# Patient Record
Sex: Female | Born: 1985 | Race: White | Hispanic: No | Marital: Married | Smoking: Never smoker
Health system: Southern US, Community
[De-identification: ages and names within clinical notes are randomized; demographics above are authoritative.]

---

## 2013-10-05 ENCOUNTER — Other Ambulatory Visit (HOSPITAL_COMMUNITY): Payer: Self-pay | Admitting: Obstetrics and Gynecology

## 2013-10-05 DIAGNOSIS — N979 Female infertility, unspecified: Secondary | ICD-10-CM

## 2013-10-11 ENCOUNTER — Ambulatory Visit (HOSPITAL_COMMUNITY): Payer: BC Managed Care – PPO

## 2013-10-11 ENCOUNTER — Ambulatory Visit (HOSPITAL_COMMUNITY)
Admission: RE | Admit: 2013-10-11 | Discharge: 2013-10-11 | Disposition: A | Discharge status: Home / Self Care | Payer: BC Managed Care – PPO | Source: Ambulatory Visit | Attending: Obstetrics and Gynecology | Admitting: Obstetrics and Gynecology

## 2013-10-11 DIAGNOSIS — N979 Female infertility, unspecified: Secondary | ICD-10-CM | POA: Insufficient documentation

## 2013-10-11 MED ORDER — IOHEXOL 300 MG/ML  SOLN
20.0000 mL | Freq: Once | INTRAMUSCULAR | Status: AC | PRN
  Administered 2013-10-11: 6 mL

## 2014-07-11 IMAGING — RF DG HYSTEROGRAM
5 series · 5 of 5 positions shown · IV contrast (omnipaque)
Comparison: None.

FLUOROSCOPY TIME:  48 seconds

CLINICAL DATA: Infertility

EXAM:
HYSTEROSALPINGOGRAM
TECHNIQUE: Following cleansing of the cervix and vagina with Betadine solution,
a hysterosalpingogram was performed using a 5-French
hysterosalpingogram catheter and Omnipaque 300 contrast. The patient
tolerated the examination without difficulty.

[Series 1: run · 1 of 1 slices shown (1 of 5)]
[im 1/1]
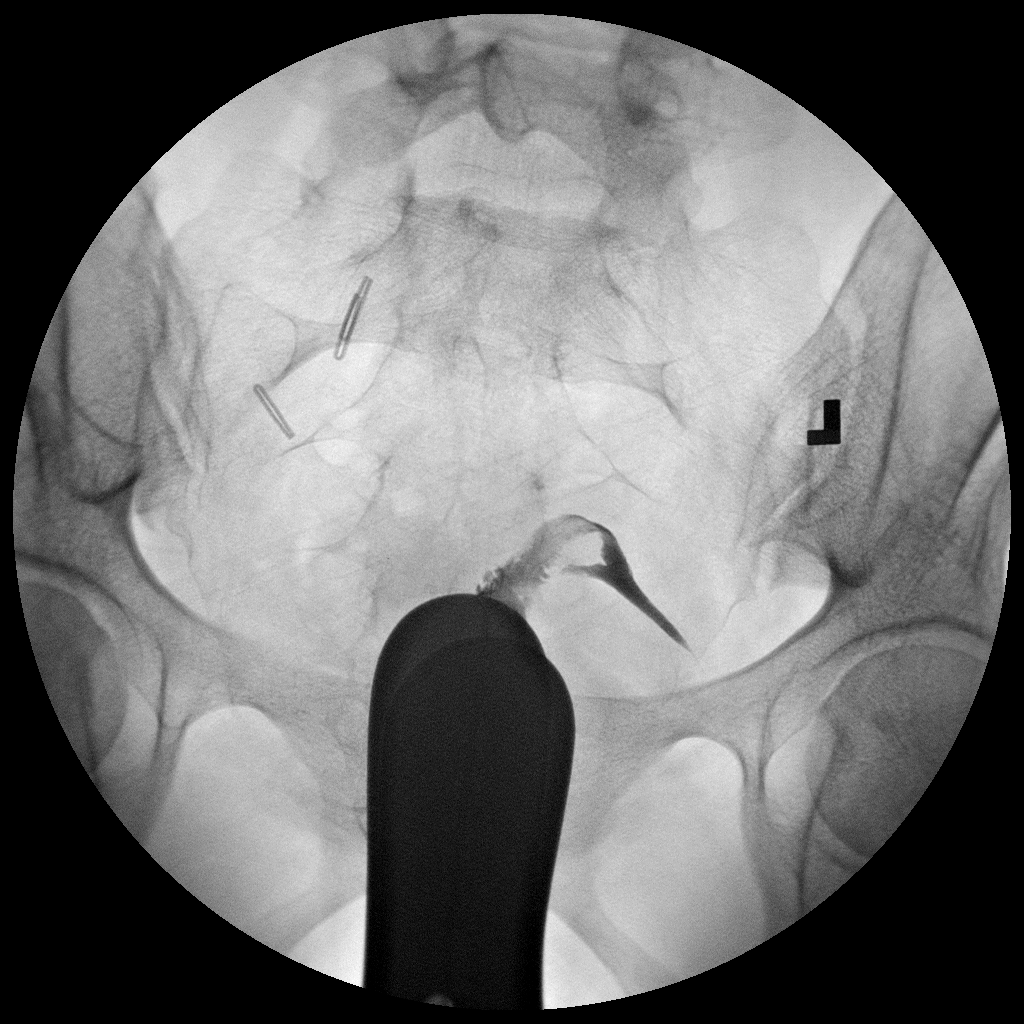

[Series 2: run · 1 of 1 slices shown (2 of 5)]
[im 1/1]
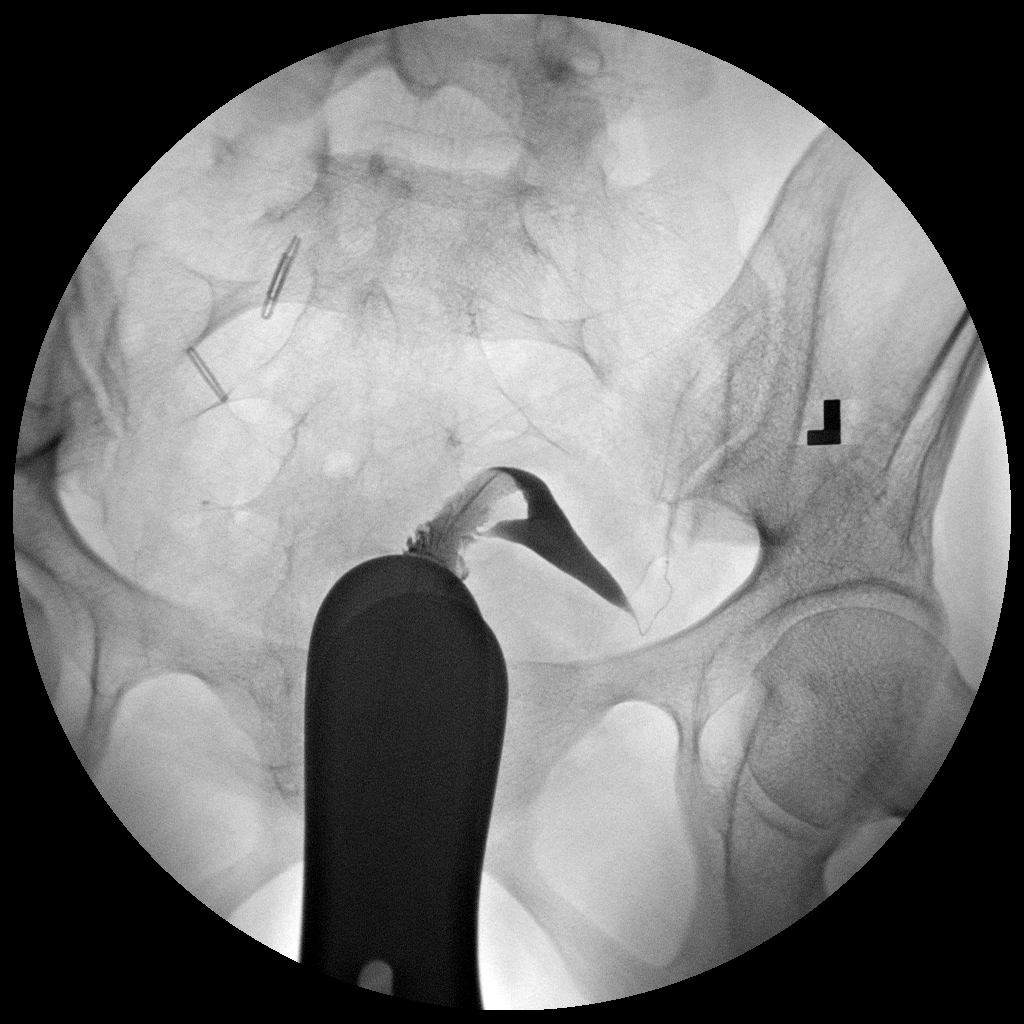

[Series 3: run · 1 of 1 slices shown (3 of 5)]
[im 1/1]
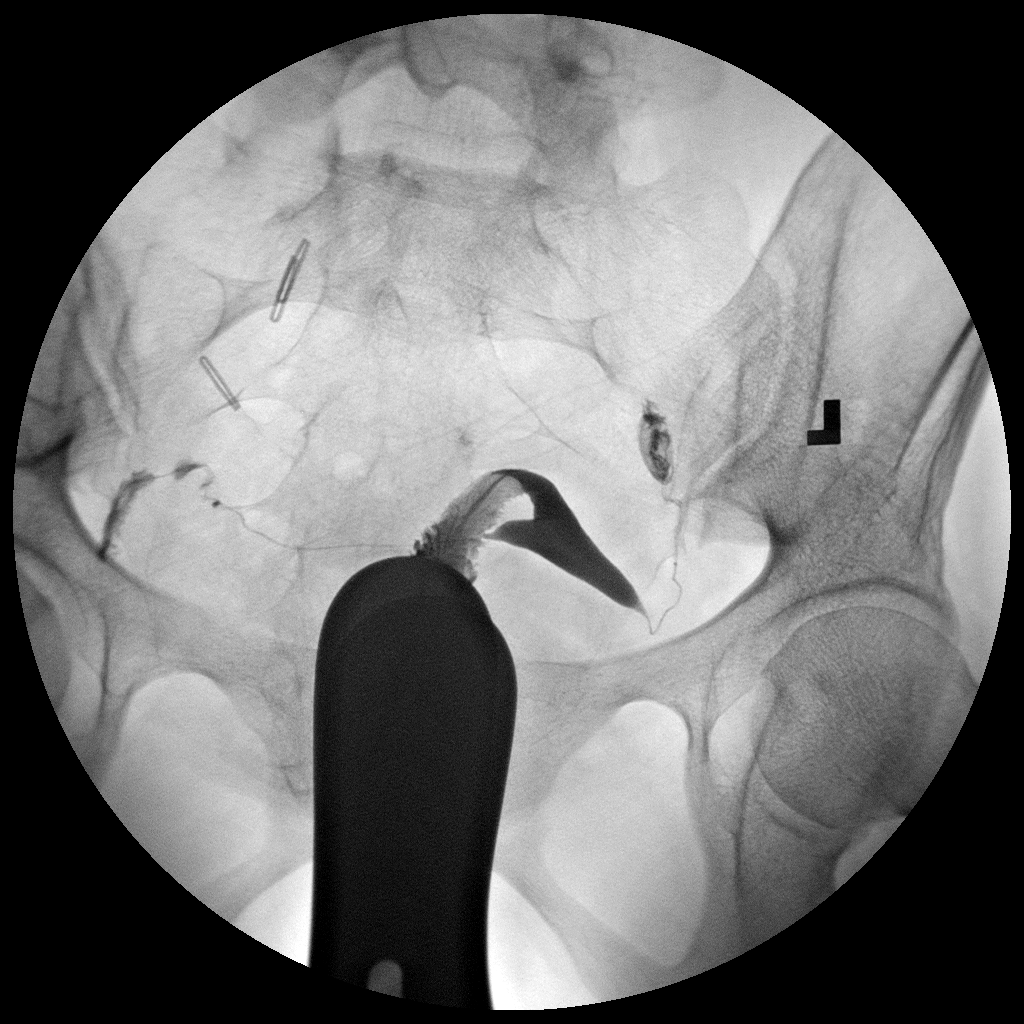

[Series 4: run · 1 of 1 slices shown (4 of 5)]
[im 1/1]
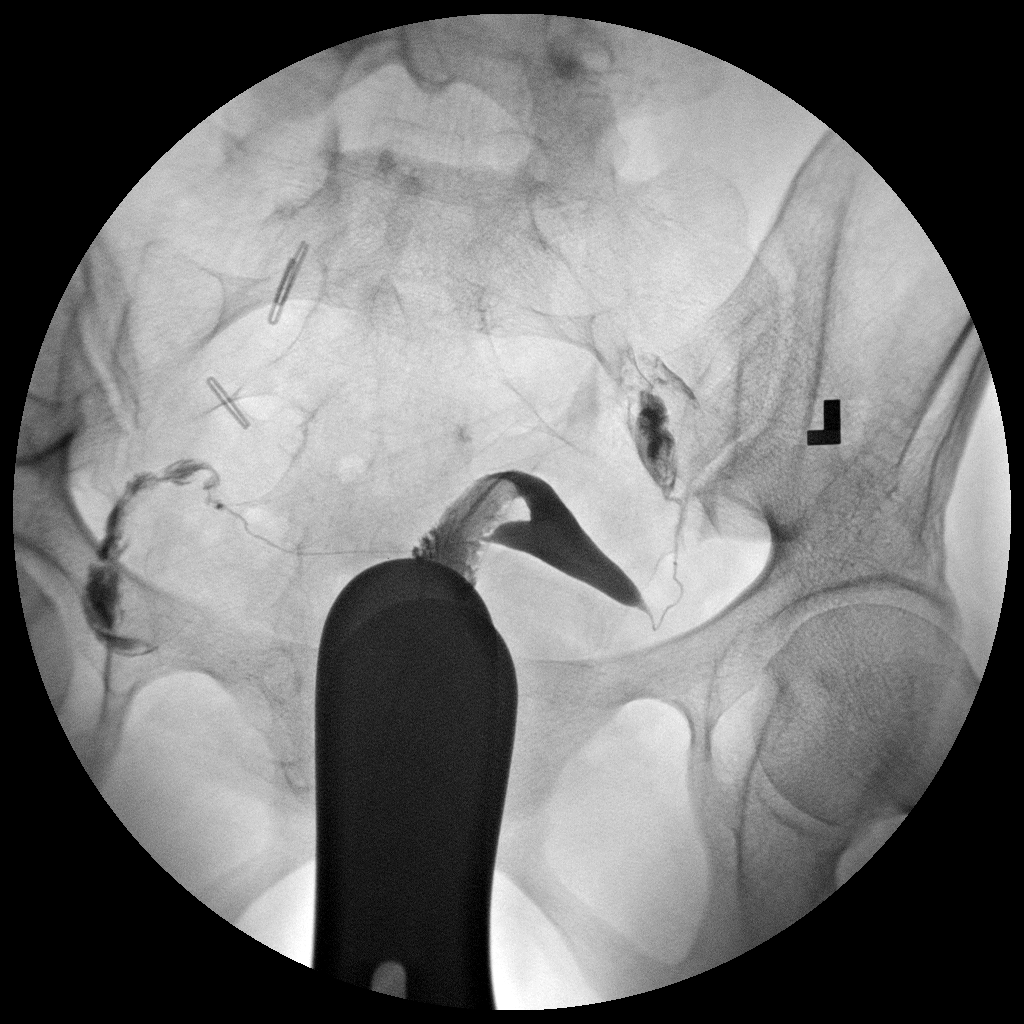

[Series 5: run · 1 of 1 slices shown (5 of 5)]
[im 1/1]
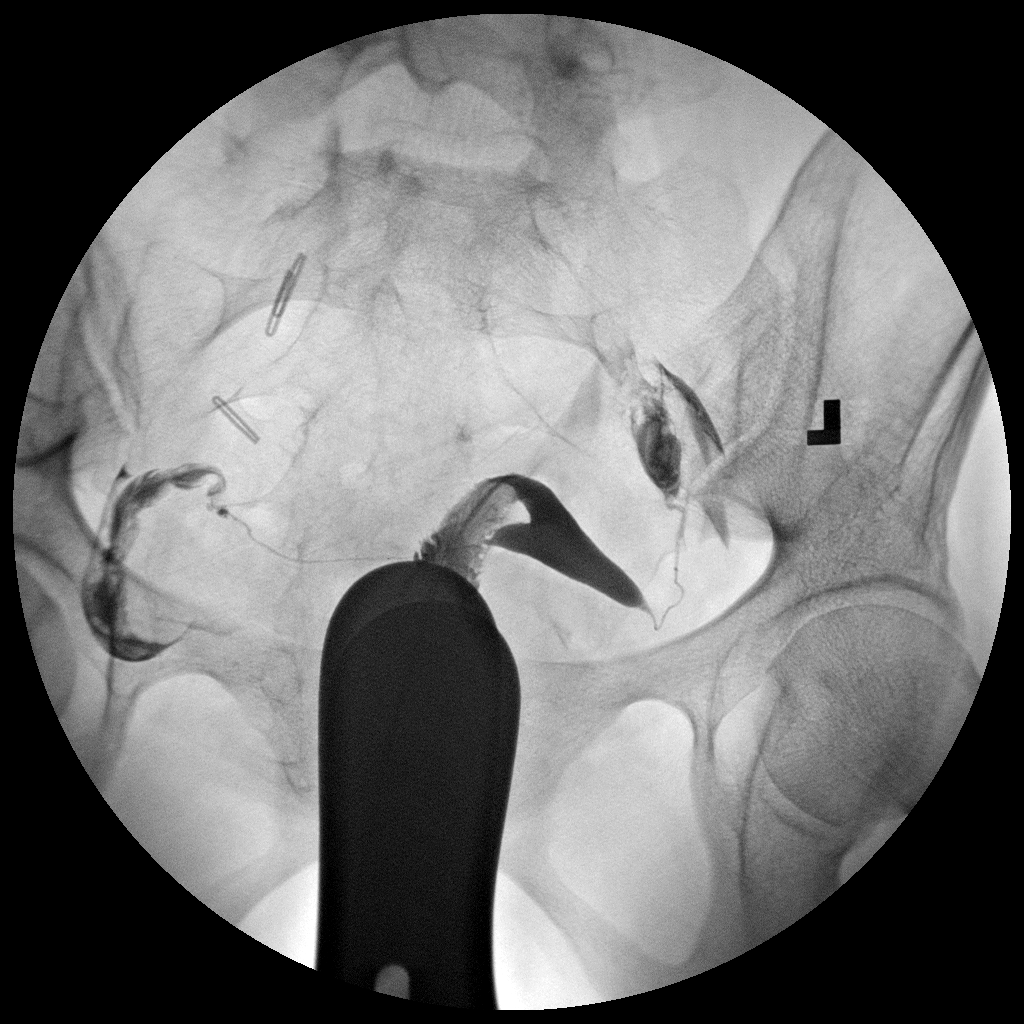

[5 of 5 positions shown; findings below may reference images not displayed]

FINDINGS: The uterus is tilted to the left. Endometrial cavity is normal. Both
fallopian tubes fill and have a normal appearance. Normal spillage
bilaterally.
IMPRESSION: Normal study.

## 2019-03-23 ENCOUNTER — Telehealth: Payer: Self-pay | Admitting: Plastic Surgery

## 2019-03-23 NOTE — Telephone Encounter (Signed)

## 2019-03-24 ENCOUNTER — Encounter: Payer: Self-pay | Admitting: Plastic Surgery

## 2019-03-24 ENCOUNTER — Ambulatory Visit (INDEPENDENT_AMBULATORY_CARE_PROVIDER_SITE_OTHER): Payer: Self-pay | Admitting: Plastic Surgery

## 2019-03-24 ENCOUNTER — Other Ambulatory Visit: Payer: Self-pay

## 2019-03-24 VITALS — BP 101/68 | HR 82 | Temp 98.7°F | Ht 63.0 in | Wt 118.8 lb

## 2019-03-24 DIAGNOSIS — Z719 Counseling, unspecified: Secondary | ICD-10-CM

## 2019-03-24 NOTE — Progress Notes (Signed)
Botulinum Toxin Procedure Note  Procedure: Cosmetic botulinum toxin   Pre-operative Diagnosis: Dynamic rhytides   Post-operative Diagnosis: Same  Complications:  None  Brief history: The patient desires botulinum toxin injection of her forehead. I discussed with the patient this proposed procedure of botulinum toxin injections, which is customized depending on the particular needs of the patient. It is performed on facial rhytids as a temporary correction. The alternatives were discussed with the patient. The risks were addressed including bleeding, scarring, infection, damage to deeper structures, asymmetry, and chronic pain, which may occur infrequently after a procedure. The individual's choice to undergo a surgical procedure is based on the comparison of risks to potential benefits. Other risks include unsatisfactory results, brow ptosis, eyelid ptosis, allergic reaction, temporary paralysis, which should go away with time, bruising, blurring disturbances and delayed healing. Botulinum toxin injections do not arrest the aging process or produce permanent tightening of the eyelid.  Operative intervention maybe necessary to maintain the results of a blepharoplasty or botulinum toxin. The patient understands and wishes to proceed. An informed consent was signed and informational brochures given to her prior to the procedure.  Procedure: The area was prepped with alcohol and dried with a clean gauze. Using a clean technique, the botulinum toxin was diluted with 1.25 cc of preservative-free normal saline which was slowly injected with an 18 gauge needle in a tuberculin syringes.  A 32 gauge needles were then used to inject the botulinum toxin. This mixture allow for an aliquot of 5 units per 0.1 cc in each injection site.    Subsequently the mixture was injected in the glabellar and lateral eyebrow. A total of 14 Units of botulinum toxin was used.  No complications were noted. Light pressure was held  for 5 minutes. She was instructed explicitly in post-operative care.  Botox LOT:  [id number] EXP:  12/22

## 2019-08-04 ENCOUNTER — Encounter: Payer: Self-pay | Admitting: Plastic Surgery

## 2019-09-05 ENCOUNTER — Encounter: Payer: Self-pay | Admitting: Plastic Surgery

## 2019-09-05 ENCOUNTER — Other Ambulatory Visit: Payer: Self-pay

## 2019-09-05 ENCOUNTER — Ambulatory Visit (INDEPENDENT_AMBULATORY_CARE_PROVIDER_SITE_OTHER): Payer: Self-pay | Admitting: Plastic Surgery

## 2019-09-05 VITALS — BP 96/70 | HR 94 | Temp 97.6°F

## 2019-09-05 DIAGNOSIS — Z719 Counseling, unspecified: Secondary | ICD-10-CM

## 2019-09-05 NOTE — Progress Notes (Signed)
Botulinum Toxin Procedure Note  Procedure: Cosmetic botulinum toxin   Pre-operative Diagnosis: Dynamic rhytides   Post-operative Diagnosis: Same  Complications:  None  Brief history: The patient desires botulinum toxin injection of her forehead. I discussed with the patient this proposed procedure of botulinum toxin injections, which is customized depending on the particular needs of the patient. It is performed on facial rhytids as a temporary correction. The alternatives were discussed with the patient. The risks were addressed including bleeding, scarring, infection, damage to deeper structures, asymmetry, and chronic pain, which may occur infrequently after a procedure. The individual's choice to undergo a surgical procedure is based on the comparison of risks to potential benefits. Other risks include unsatisfactory results, brow ptosis, eyelid ptosis, allergic reaction, temporary paralysis, which should go away with time, bruising, blurring disturbances and delayed healing. Botulinum toxin injections do not arrest the aging process or produce permanent tightening of the eyelid.  Operative intervention maybe necessary to maintain the results of a blepharoplasty or botulinum toxin. The patient understands and wishes to proceed. An informed consent was signed and informational brochures given to her prior to the procedure.  Procedure: The area was prepped with alcohol and dried with a clean gauze. Using a clean technique, the botulinum toxin was diluted with 1.25 cc of preservative-free normal saline which was slowly injected with an 18 gauge needle in a tuberculin syringes.  A 32 gauge needles were then used to inject the botulinum toxin. This mixture allow for an aliquot of 5 units per 0.1 cc in each injection site.    Subsequently the mixture was injected in the glabellar and forehead area with preservation of the temporal branch to the lateral eyebrow as well as into each lateral canthal area  beginning from the lateral orbital rim medial to the zygomaticus major in 3 separate areas. A total of 25 Units of botulinum toxin was used. The forehead and glabellar area was injected with care to inject intramuscular only while holding pressure on the supratrochlear vessels in each area during each injection on either side of the medial corrugators. The injection proceeded vertically superiorly to the medial 2/3 of the frontalis muscle and superior 2/3 of the lateral frontalis, again with preservation of the frontal branch.  The midface area was injected at the 3 sub-regions of the mid-face: zygomaticomalar region, anteromedial cheek region, and submalar region for a total of one syringe on each side of the face. The technique used was serial puncture with equal injections in the 3 sub-regions: the zygomaticomalar region, the anteromedial cheek, and the submalar region.  No complications were noted. Light pressure was held for 5 minutes. She was instructed explicitly in post-operative care.  Botox LOT:  [id number] [id number] EXP:  7/23
